# Patient Record
Sex: Female | Born: 1985 | Race: White | Hispanic: No | Marital: Single
Health system: Southern US, Community
[De-identification: ages and names within clinical notes are randomized; demographics above are authoritative.]

---

## 2007-10-23 ENCOUNTER — Emergency Department: Payer: Self-pay | Admitting: Emergency Medicine

## 2008-04-14 ENCOUNTER — Emergency Department: Payer: Self-pay | Admitting: Emergency Medicine

## 2009-12-20 ENCOUNTER — Emergency Department: Payer: Self-pay | Admitting: Emergency Medicine

## 2010-01-14 ENCOUNTER — Emergency Department: Payer: Self-pay | Admitting: Emergency Medicine

## 2011-02-10 ENCOUNTER — Emergency Department: Payer: Self-pay | Admitting: Emergency Medicine

## 2011-03-13 IMAGING — CR RIGHT ANKLE - COMPLETE 3+ VIEW
1 series · 5 of 5 positions shown · non-contrast
Comparison: none

REASON FOR EXAM: injury to right ankle, now with pain and swelling
COMMENTS:

PROCEDURE:     DXR - DXR ANKLE RIGHT COMPLETE  - December 20, 2009 [DATE]
RESULT:     No fracture, dislocation or other acute bony abnormality is
identified. The ankle mortise is well-maintained.

[Series 1: view not recorded · 0.17mm/px · 5 of 5 slices shown]
[im 1/5]
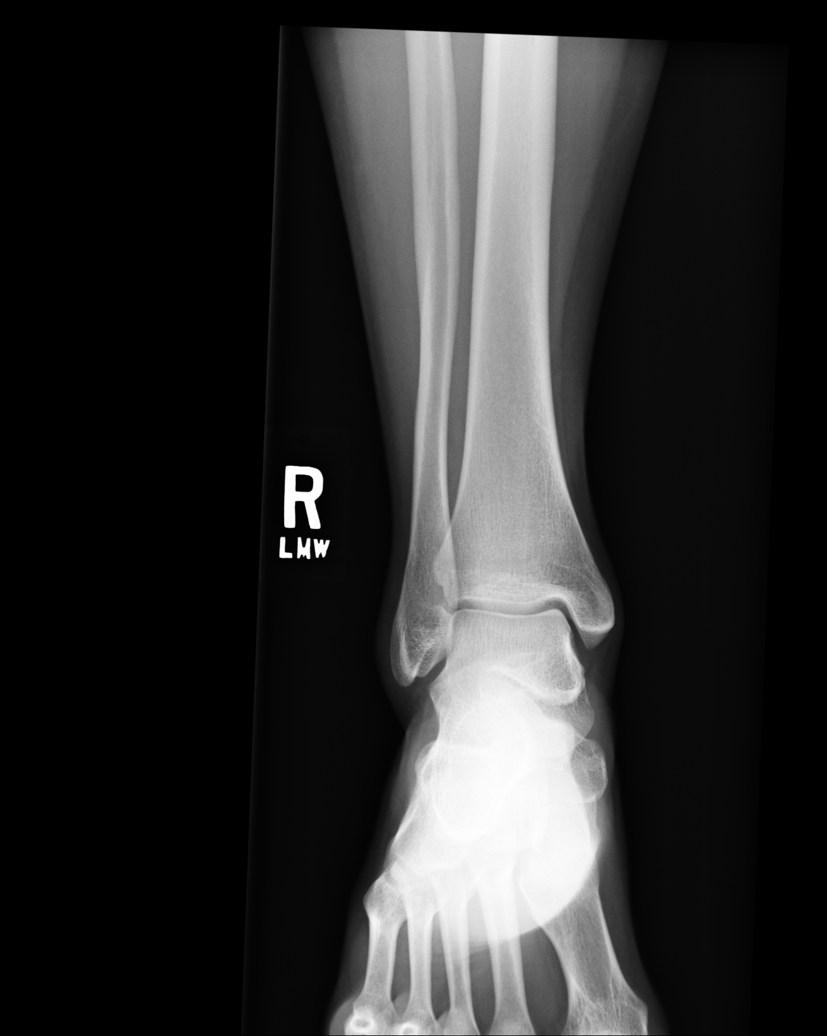
[im 2/5]
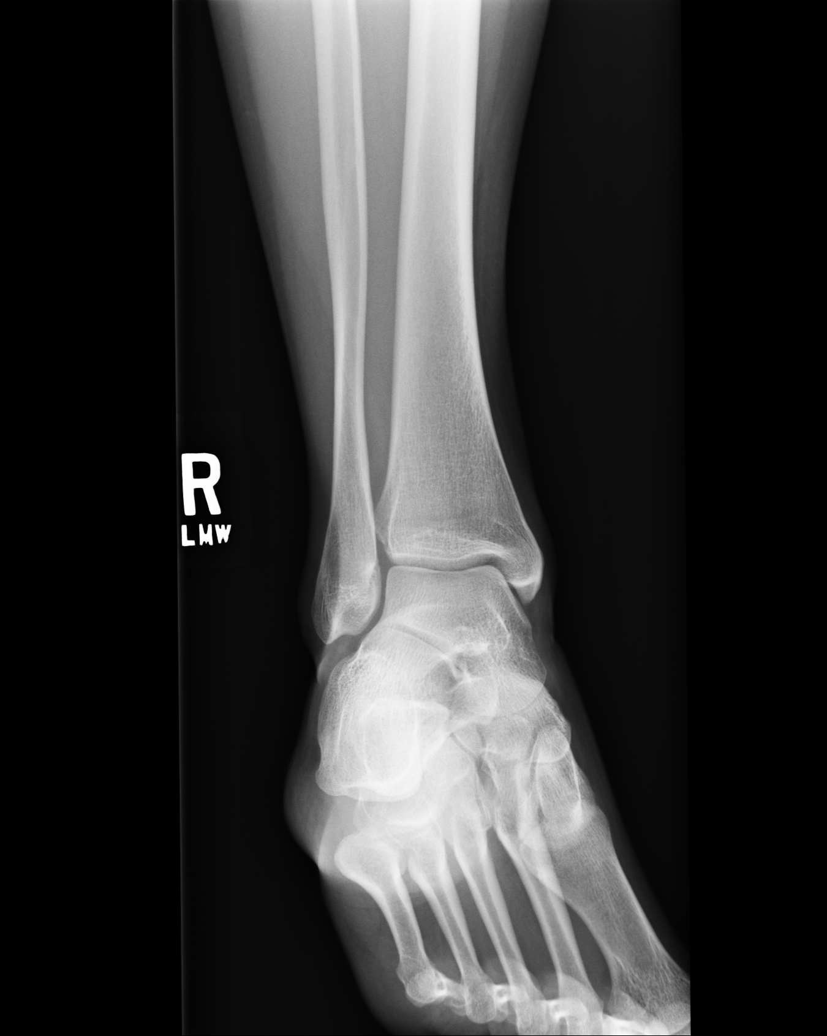
[im 3/5]
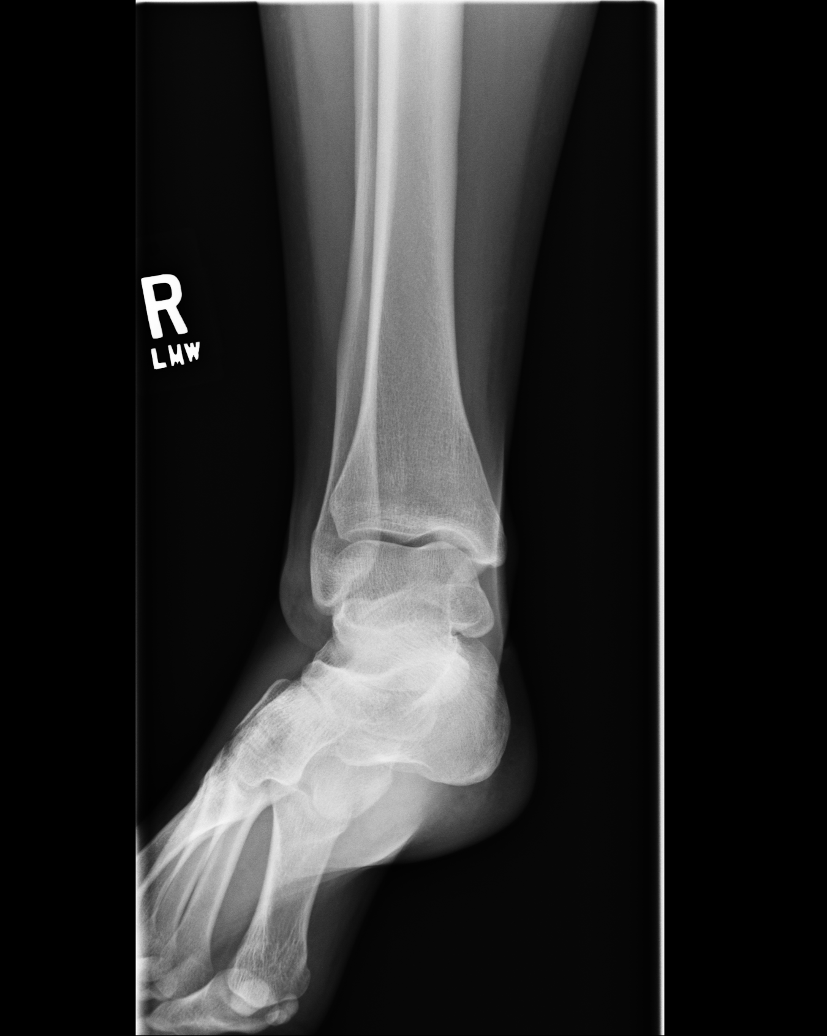
[im 4/5]
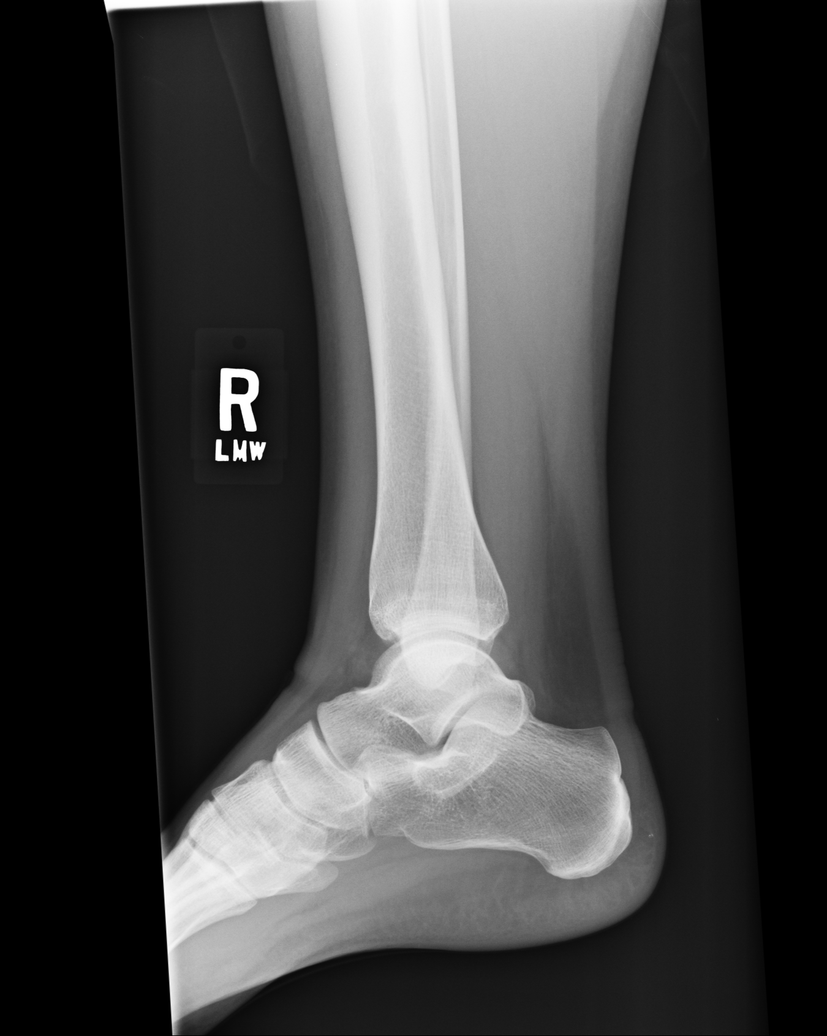
[im 5/5]
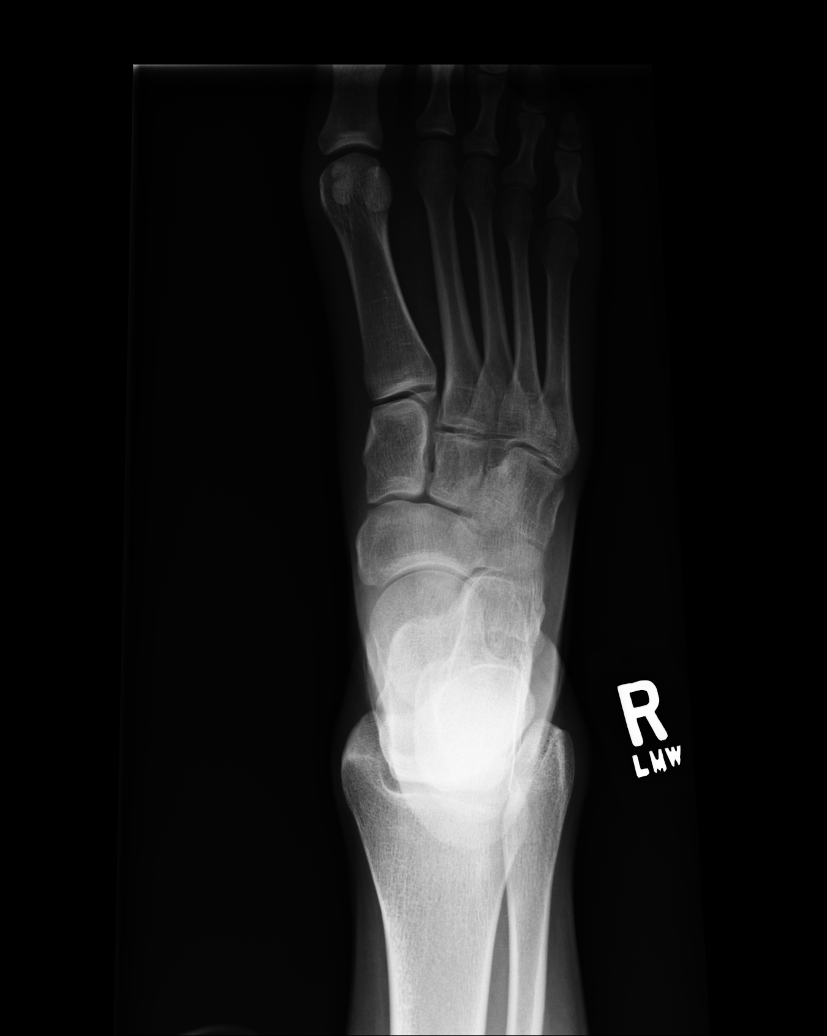

[5 of 5 positions shown; findings below may reference images not displayed]

IMPRESSION: 1.     No significant osseous abnormalities are noted.

## 2011-07-07 ENCOUNTER — Encounter: Payer: Self-pay | Admitting: Family Medicine

## 2011-07-26 ENCOUNTER — Encounter: Payer: Self-pay | Admitting: Family Medicine

## 2012-02-25 ENCOUNTER — Emergency Department: Payer: Self-pay | Admitting: Emergency Medicine

## 2012-05-10 ENCOUNTER — Emergency Department: Payer: Self-pay | Admitting: Emergency Medicine

## 2012-08-31 ENCOUNTER — Inpatient Hospital Stay: Payer: Self-pay

## 2012-08-31 LAB — CBC WITH DIFFERENTIAL/PLATELET
Basophil #: 0 10*3/uL (ref 0.0–0.1)
Basophil %: 0.5 %
Eosinophil #: 0.3 10*3/uL (ref 0.0–0.7)
HCT: 42.5 % (ref 35.0–47.0)
HGB: 14.7 g/dL (ref 12.0–16.0)
Lymphocyte #: 1.5 10*3/uL (ref 1.0–3.6)
Lymphocyte %: 16.9 %
MCH: 32.5 pg (ref 26.0–34.0)
MCHC: 34.6 g/dL (ref 32.0–36.0)
Monocyte #: 0.7 x10 3/mm (ref 0.2–0.9)
Neutrophil %: 71.1 %
Platelet: 167 10*3/uL (ref 150–440)
RDW: 13.3 % (ref 11.5–14.5)

## 2012-09-01 LAB — HEMATOCRIT: HCT: 37.8 % (ref 35.0–47.0)

## 2013-11-11 ENCOUNTER — Emergency Department: Payer: Self-pay | Admitting: Emergency Medicine

## 2013-11-11 LAB — COMPREHENSIVE METABOLIC PANEL
Alkaline Phosphatase: 69 U/L
BUN: 12 mg/dL (ref 7–18)
Bilirubin,Total: 0.6 mg/dL (ref 0.2–1.0)
Co2: 29 mmol/L (ref 21–32)
EGFR (African American): >60
EGFR (Non-African Amer.): >60
Glucose: 110 mg/dL — ABNORMAL HIGH (ref 65–99)
Potassium: 3.5 mmol/L (ref 3.5–5.1)
SGOT(AST): 26 U/L (ref 15–37)
SGPT (ALT): 19 U/L (ref 12–78)
Total Protein: 7.8 g/dL (ref 6.4–8.2)

## 2013-11-11 LAB — CBC
HCT: 43.3 % (ref 35.0–47.0)
HGB: 14.7 g/dL (ref 12.0–16.0)
MCH: 31.2 pg (ref 26.0–34.0)
MCHC: 33.9 g/dL (ref 32.0–36.0)
MCV: 92 fL (ref 80–100)
Platelet: 226 10*3/uL (ref 150–440)
RDW: 12.6 % (ref 11.5–14.5)
WBC: 9.2 10*3/uL (ref 3.6–11.0)

## 2013-11-11 LAB — RAPID INFLUENZA A&B ANTIGENS

## 2014-01-23 ENCOUNTER — Emergency Department: Payer: Self-pay | Admitting: Emergency Medicine

## 2015-04-03 NOTE — H&P (Signed)
L&D Evaluation:  History:   HPI 89 yhear old G2 P1001 with EDC=08/25/2012 by a 9wk1d ultrasound who presents at 4740 6/7 weeks with LOF since 1130 last night. Fluid appeared pink tinged. Contractions increased in frequency and intensity since then. Baby active. No N/V/D.  [facility] and has been remarkable for a normal anatomy scan, varicose veins of the lower extremities, an elevated one hr GTT with a normal 3hr GTT, and for BV treated with Metrogel. LABS: A POS, RI, VI, GBS negative. Received TDAP 06/28/2012. Past OB hx: SVD of a 8 pound female.    Presents with contractions, leaking fluid    Patient's Medical History No Chronic Illness    Patient's Surgical History none    Medications Pre Natal Vitamins    Allergies NKDA    Social History none    Family History Non-Contributory   ROS:   ROS see HPI   Exam:   Vital Signs stable    General no apparent distress    Mental Status clear    Chest clear    Heart normal sinus rhythm, no murmur/gallop/rubs    Abdomen gravid, tender with contractions    Estimated Fetal Weight Average for gestational age    Fetal Position cephalic    Edema no edema    Reflexes 1+    Pelvic no external lesions, 1.5/80%/-1 to 0-no change since arrival 2 hours ago    Mebranes Ruptured    Description pink tinged initially, ?MSAF on pad    FHT normal rate with no decels, 135 with accels to 150s-160s    FHT Description CAt1    Fetal Heart Rate 130     Ucx q4-6, palpate mild-mod    Skin dry   Impression:   Impression IUP at 40.6 weeks with SROM and ?MSAF in early labor.   Plan:   Plan EFM/NST, monitor contractions and for cervical change, If no change by 0530-0600, start Pitocin.   Electronic Signatures: Trinna BalloonGutierrez, Dail Meece L (CNM)  (Signed 08-Oct-13 03:43)  Authored: L&D Evaluation   Last Updated: 08-Oct-13 03:43 by Trinna BalloonGutierrez, Marithza Malachi L (CNM)
